# Patient Record
Sex: Female | Born: 1940 | Race: White | Hispanic: No | Smoking: Never smoker
Health system: Southern US, Community
[De-identification: ages and names within clinical notes are randomized; demographics above are authoritative.]

## PROBLEM LIST (undated history)

## (undated) DIAGNOSIS — G8929 Other chronic pain: Secondary | ICD-10-CM

## (undated) DIAGNOSIS — E785 Hyperlipidemia, unspecified: Secondary | ICD-10-CM

## (undated) DIAGNOSIS — M549 Dorsalgia, unspecified: Secondary | ICD-10-CM

## (undated) DIAGNOSIS — K219 Gastro-esophageal reflux disease without esophagitis: Secondary | ICD-10-CM

## (undated) HISTORY — PX: BACK SURGERY: SHX140

## (undated) HISTORY — PX: TONSILLECTOMY: SUR1361

---

## 2015-05-14 ENCOUNTER — Emergency Department (HOSPITAL_BASED_OUTPATIENT_CLINIC_OR_DEPARTMENT_OTHER)
Admission: EM | Admit: 2015-05-14 | Discharge: 2015-05-15 | Disposition: A | Discharge status: Home / Self Care | Payer: Medicare Other | Attending: Emergency Medicine | Admitting: Emergency Medicine

## 2015-05-14 ENCOUNTER — Encounter (HOSPITAL_BASED_OUTPATIENT_CLINIC_OR_DEPARTMENT_OTHER): Payer: Self-pay | Admitting: *Deleted

## 2015-05-14 DIAGNOSIS — Z87440 Personal history of urinary (tract) infections: Secondary | ICD-10-CM | POA: Diagnosis not present

## 2015-05-14 DIAGNOSIS — Z792 Long term (current) use of antibiotics: Secondary | ICD-10-CM | POA: Diagnosis not present

## 2015-05-14 DIAGNOSIS — Z79899 Other long term (current) drug therapy: Secondary | ICD-10-CM | POA: Insufficient documentation

## 2015-05-14 DIAGNOSIS — K219 Gastro-esophageal reflux disease without esophagitis: Secondary | ICD-10-CM | POA: Diagnosis not present

## 2015-05-14 DIAGNOSIS — E785 Hyperlipidemia, unspecified: Secondary | ICD-10-CM | POA: Insufficient documentation

## 2015-05-14 DIAGNOSIS — R110 Nausea: Secondary | ICD-10-CM | POA: Insufficient documentation

## 2015-05-14 DIAGNOSIS — R109 Unspecified abdominal pain: Secondary | ICD-10-CM | POA: Insufficient documentation

## 2015-05-14 DIAGNOSIS — G8929 Other chronic pain: Secondary | ICD-10-CM | POA: Insufficient documentation

## 2015-05-14 DIAGNOSIS — Z791 Long term (current) use of non-steroidal anti-inflammatories (NSAID): Secondary | ICD-10-CM | POA: Insufficient documentation

## 2015-05-14 HISTORY — DX: Other chronic pain: G89.29

## 2015-05-14 HISTORY — DX: Hyperlipidemia, unspecified: E78.5

## 2015-05-14 HISTORY — DX: Dorsalgia, unspecified: M54.9

## 2015-05-14 HISTORY — DX: Gastro-esophageal reflux disease without esophagitis: K21.9

## 2015-05-14 LAB — URINALYSIS, ROUTINE W REFLEX MICROSCOPIC
Bilirubin Urine: NEGATIVE
Glucose, UA: NEGATIVE mg/dL
Hgb urine dipstick: NEGATIVE
Ketones, ur: NEGATIVE mg/dL
Leukocytes, UA: NEGATIVE
Nitrite: NEGATIVE
Protein, ur: NEGATIVE mg/dL
Specific Gravity, Urine: 1.015 (ref 1.005–1.030)
pH: 7 (ref 5.0–8.0)

## 2015-05-14 NOTE — ED Notes (Signed)
Pt c/o right flank pain x 10 hrs , Seen by PMD DX UTI  today given cipro and Vicodin

## 2015-05-14 NOTE — ED Provider Notes (Signed)
CSN: [id number]     Arrival date & time 05/14/15  2115 History  By signing my name below, I, Soijett Blue, attest that this documentation has been prepared under the direction and in the presence of Shon Baton, MD. Electronically Signed: Soijett Blue, ED Scribe. 05/14/2015. 11:49 PM.   Chief Complaint  Patient presents with  . Flank Pain      The history is provided by the patient. No language interpreter was used.    Jeanette Riggs is a 74 y.o. female who presents to the Emergency Department complaining of 9.5/10, sudden, constant, non-radiating, right flank pain onset 11 AM. She reports that she went to an urgent care who informed her that she had an UTI and was Rx cipro and vicodin with her last dose being at 5 PM for the cipro and 3 PM and 9 PM for the vicodin. She reports that applying pressure to the area alleviates her symptoms and that nothing worsens her symptoms at this time. She is having associated symptoms of nausea. She notes that she has not tried any medications for the relief of her symptoms. She denies fever, dysuria, hematuria, CP, SOB, cough, gait problem, difficulty urinating, constipation, vomiting, and any other symptoms. Pt is allergic to prednisone.    Past Medical History  Diagnosis Date  . Hyperlipemia   . GERD (gastroesophageal reflux disease)   . Chronic back pain    Past Surgical History  Procedure Laterality Date  . Back surgery    . Tonsillectomy     History reviewed. No pertinent family history. Social History  Substance Use Topics  . Smoking status: Never Smoker   . Smokeless tobacco: None  . Alcohol Use: No   OB History    No data available     Review of Systems  Constitutional: Negative for fever.  Respiratory: Negative for cough, chest tightness and shortness of breath.   Cardiovascular: Negative for chest pain.  Gastrointestinal: Positive for nausea. Negative for vomiting, abdominal pain and diarrhea.  Genitourinary: Positive for  flank pain. Negative for difficulty urinating.  Skin: Negative for rash.  Neurological: Negative for weakness and numbness.  All other systems reviewed and are negative.     Allergies  Prednisone  Home Medications   Prior to Admission medications   Medication Sig Start Date End Date Taking? Authorizing Provider  ciprofloxacin (CIPRO) 500 MG tablet Take 500 mg by mouth 2 (two) times daily.   Yes Historical Provider, MD  esomeprazole (NEXIUM) 40 MG capsule Take 40 mg by mouth daily at 12 noon.   Yes Historical Provider, MD  meloxicam (MOBIC) 7.5 MG tablet Take 7.5 mg by mouth daily.   Yes Historical Provider, MD  oxyCODONE-acetaminophen (PERCOCET/ROXICET) 5-325 MG tablet Take 1 tablet by mouth every 6 (six) hours as needed for severe pain. 05/15/15   Shon Baton, MD  simvastatin (ZOCOR) 10 MG tablet Take 10 mg by mouth daily.   Yes Historical Provider, MD   BP 163/79 mmHg  Pulse 75  Temp(Src) 97.8 F (36.6 C) (Oral)  Resp 18  Ht  (1.6 m)  Wt 140 lb (63.504 kg)  BMI 24.81 kg/m2  SpO2 97%  LMP 05/14/2015 Physical Exam  Constitutional: She is oriented to person, place, and time. She appears well-developed and well-nourished. No distress.  Appears younger than stated age  HENT:  Head: Normocephalic and atraumatic.  Cardiovascular: Normal rate, regular rhythm and normal heart sounds.   No murmur heard. Pulmonary/Chest: Effort normal and breath  sounds normal. No respiratory distress. She has no wheezes.  Abdominal: Soft. Bowel sounds are normal. There is no tenderness. There is no rebound and no guarding.  Genitourinary:  No CVA tenderness  Musculoskeletal: She exhibits no edema.  Neurological: She is alert and oriented to person, place, and time.  5 out of 5 strength bilateral lower extremities  Skin: Skin is warm and dry. No rash noted.  Psychiatric: She has a normal mood and affect.  Nursing note and vitals reviewed.   ED Course  Procedures (including critical  care time) DIAGNOSTIC STUDIES: Oxygen Saturation is 100% on RA, nl by my interpretation.    COORDINATION OF CARE: 11:48 PM Discussed treatment plan with pt at bedside which includes UA, pain medication and pt agreed to plan.    Labs Review Labs Reviewed  BASIC METABOLIC PANEL - Abnormal; Notable for the following:    Potassium 3.4 (*)    Glucose, Bld 111 (*)    Anion gap 4 (*)    All other components within normal limits  URINALYSIS, ROUTINE W REFLEX MICROSCOPIC (NOT [facility])  CBC WITH DIFFERENTIAL/PLATELET    Imaging Review Ct Renal Stone Study  05/15/2015  CLINICAL DATA:  74 year old female with right flank pain and nausea EXAM: CT ABDOMEN AND PELVIS WITHOUT CONTRAST TECHNIQUE: Multidetector CT imaging of the abdomen and pelvis was performed following the standard protocol without IV contrast. COMPARISON:  None. FINDINGS: Evaluation of this exam is limited in the absence of intravenous contrast. The visualized lung bases are clear. No intra-abdominal free air or free fluid. The liver, gallbladder, pancreas, spleen, adrenal glands appear unremarkable. There are bilateral extrarenal pelvises. There is mild fullness of the renal pelvis bilaterally. There is no hydronephrosis or nephrolithiasis on either side. The visualized ureters and urinary bladder appear unremarkable. The uterus and the ovaries are grossly unremarkable. There is sigmoid diverticulosis with scattered colonic diverticula without active inflammation. Copious amount of dense stool noted throughout the colon compatible with constipation. Multiple normal caliber fecalized loops of small bowel noted suggestive of chronic stasis. There is no evidence of bowel obstruction. Normal appendix. There is aortoiliac atherosclerotic disease. The abdominal aorta and IVC appear grossly unremarkable on this noncontrast study. No portal venous gas identified. There is no adenopathy. There is mild haziness of the upper mesentery with multiple  small lymph nodes with a "misty mesentery" appearance. This finding is nonspecific but may be related to underlying inflammatory/infectious etiology. The abdominal wall soft tissues appear unremarkable. Forty-four there is degenerative changes of the spine. No acute fracture. IMPRESSION: No hydronephrosis or nephrolithiasis. Constipation. No evidence of bowel obstruction or inflammation. Normal appendix. Diverticulosis. Electronically Signed   By: Elgie CollardArash  Radparvar M.D.   On: 05/15/2015 01:18   I have personally reviewed and evaluated these images and lab results as part of my medical decision-making.   EKG Interpretation None      MDM   Final diagnoses:  Flank pain    Patient presents with right flank pain. Onset 10 hours ago. Onset is sudden. Currently on Cipro and Vicodin. No evidence of urinary tract infection and patient has only taken 1 dose of antibiotics. No CVA tenderness on exam. Kidney stone is a consideration. Patient was given fluids, nausea, and pain medication. Basic labwork obtained as well as CT renal stone study.  Pain is not reproducible on exam. She is neurologically intact.  CT is negative for stone.  Basic labwork is reassuring. On recheck, patient reports improvement of symptoms with symptom control.  Discussed with patient that it is unclear this time the etiology for pain. No evidence of UTI. No evidence of renal stone. Otherwise her CT scan is largely unremarkable with the exception of constipation. This could be prodrome to zoster and patient was last if she were to develop a rash she needs to be reevaluated. Patient stated understanding. Continue supportive care home. Patient was given strict return precautions.  After history, exam, and medical workup I feel the patient has been appropriately medically screened and is safe for discharge home. Pertinent diagnoses were discussed with the patient. Patient was given return precautions.  I personally performed the services  described in this documentation, which was scribed in my presence. The recorded information has been reviewed and is accurate.    Shon Baton, MD 05/15/15 [phone]

## 2015-05-15 ENCOUNTER — Emergency Department (HOSPITAL_BASED_OUTPATIENT_CLINIC_OR_DEPARTMENT_OTHER): Payer: Medicare Other

## 2015-05-15 DIAGNOSIS — R109 Unspecified abdominal pain: Secondary | ICD-10-CM | POA: Diagnosis not present

## 2015-05-15 LAB — BASIC METABOLIC PANEL
ANION GAP: 4 — AB (ref 5–15)
BUN: 17 mg/dL (ref 6–20)
CALCIUM: 9.6 mg/dL (ref 8.9–10.3)
CHLORIDE: 103 mmol/L (ref 101–111)
CO2: 29 mmol/L (ref 22–32)
Creatinine, Ser: 0.69 mg/dL (ref 0.44–1.00)
GFR calc non Af Amer: >60 mL/min (ref >60)
GLUCOSE: 111 mg/dL — AB (ref 65–99)
POTASSIUM: 3.4 mmol/L — AB (ref 3.5–5.1)
Sodium: 136 mmol/L (ref 135–145)

## 2015-05-15 LAB — CBC WITH DIFFERENTIAL/PLATELET
BASOS ABS: 0 10*3/uL (ref 0.0–0.1)
BASOS PCT: 1 %
Eosinophils Absolute: 0.1 10*3/uL (ref 0.0–0.7)
Eosinophils Relative: 1 %
HEMATOCRIT: 40 % (ref 36.0–46.0)
HEMOGLOBIN: 13.3 g/dL (ref 12.0–15.0)
LYMPHS PCT: 18 %
Lymphs Abs: 1.6 10*3/uL (ref 0.7–4.0)
MCH: 31.1 pg (ref 26.0–34.0)
MCHC: 33.3 g/dL (ref 30.0–36.0)
MCV: 93.7 fL (ref 78.0–100.0)
MONO ABS: 0.8 10*3/uL (ref 0.1–1.0)
Monocytes Relative: 9 %
NEUTROS ABS: 6.1 10*3/uL (ref 1.7–7.7)
NEUTROS PCT: 71 %
Platelets: 299 10*3/uL (ref 150–400)
RBC: 4.27 MIL/uL (ref 3.87–5.11)
RDW: 12.1 % (ref 11.5–15.5)
WBC: 8.6 10*3/uL (ref 4.0–10.5)

## 2015-05-15 MED ORDER — OXYCODONE-ACETAMINOPHEN 5-325 MG PO TABS: 1.0000 | ORAL_TABLET | Freq: Four times a day (QID) | ORAL | Status: DC | PRN

## 2015-05-15 MED ORDER — ONDANSETRON HCL 4 MG/2ML IJ SOLN
4.0000 mg | Freq: Once | INTRAMUSCULAR | Status: AC
  Administered 2015-05-15: 4 mg via INTRAVENOUS
  Filled 2015-05-15: qty 2

## 2015-05-15 MED ORDER — MORPHINE SULFATE (PF) 4 MG/ML IV SOLN
4.0000 mg | Freq: Once | INTRAVENOUS | Status: AC
  Administered 2015-05-15: 4 mg via INTRAVENOUS
  Filled 2015-05-15: qty 1

## 2015-05-15 MED ORDER — SODIUM CHLORIDE 0.9 % IV BOLUS (SEPSIS)
1000.0000 mL | Freq: Once | INTRAVENOUS | Status: AC
  Administered 2015-05-15: 1000 mL via INTRAVENOUS

## 2015-05-15 NOTE — ED Notes (Signed)
Ginger Ale given for p.o. challenge  

## 2015-05-15 NOTE — Discharge Instructions (Signed)
Flank Pain °Flank pain refers to pain that is located on the side of the body between the upper abdomen and the back. The pain may occur over a short period of time (acute) or may be long-term or reoccurring (chronic). It may be mild or severe. Flank pain can be caused by many things. °CAUSES  °Some of the more common causes of flank pain include: °· Muscle strains.   °· Muscle spasms.   °· A disease of your spine (vertebral disk disease).   °· A lung infection (pneumonia).   °· Fluid around your lungs (pulmonary edema).   °· A kidney infection.   °· Kidney stones.   °· A very painful skin rash caused by the chickenpox virus (shingles).   °· Gallbladder disease.   °HOME CARE INSTRUCTIONS  °Home care will depend on the cause of your pain. In general, °· Rest as directed by your caregiver. °· Drink enough fluids to keep your urine clear or pale yellow. °· Only take over-the-counter or prescription medicines as directed by your caregiver. Some medicines may help relieve the pain. °· Tell your caregiver about any changes in your pain. °· Follow up with your caregiver as directed. °SEEK IMMEDIATE MEDICAL CARE IF:  °· Your pain is not controlled with medicine.   °· You have new or worsening symptoms. °· Your pain increases.   °· You have abdominal pain.   °· You have shortness of breath.   °· You have persistent nausea or vomiting.   °· You have swelling in your abdomen.   °· You feel faint or pass out.   °· You have blood in your urine. °· You have a fever or persistent symptoms for more than 2-3 days. °· You have a fever and your symptoms suddenly get worse. °MAKE SURE YOU:  °· Understand these instructions. °· Will watch your condition. °· Will get help right away if you are not doing well or get worse. °  °This information is not intended to replace advice given to you by your health care provider. Make sure you discuss any questions you have with your health care provider. °  °Document Released: 06/29/2005 Document  Revised: 01/31/2012 Document Reviewed: 12/21/2011 °Elsevier Interactive Patient Education ©2016 [employer] ° °

## 2015-05-15 NOTE — ED Notes (Signed)
Pt verbalizes understanding of d/c instructions and denies any further needs at this time. 

## 2017-04-13 IMAGING — CT CT RENAL STONE PROTOCOL
2 of 3 series · 15 of 46 positions shown, 17 images · non-contrast
Comparison: None.

CLINICAL DATA: 74-year-old female with right flank pain and nausea

EXAM:
CT ABDOMEN AND PELVIS WITHOUT CONTRAST
TECHNIQUE: Multidetector CT imaging of the abdomen and pelvis was performed
following the standard protocol without IV contrast.

[Series 2: stone study 5.0 i30f 1 · axial · 0.91mm/px · z∈[-446,-61]mm · 12 of 89 slices shown, 14 images]
[im 6/89  soft-tissue]
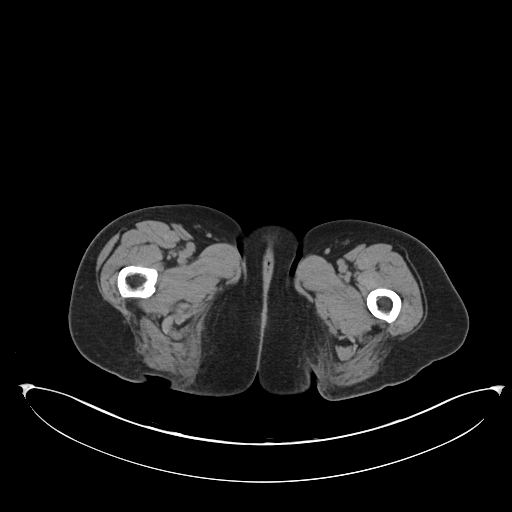
[im 6/89  bone]
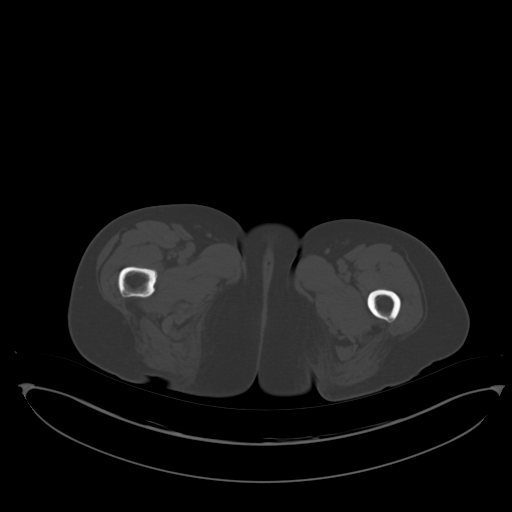
[im 12/89  soft-tissue]
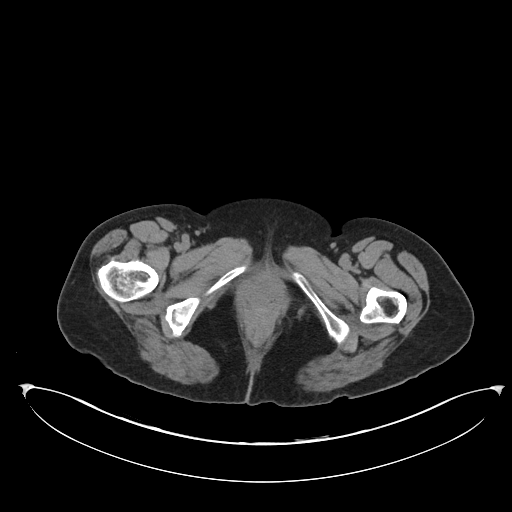
[im 20/89  soft-tissue]
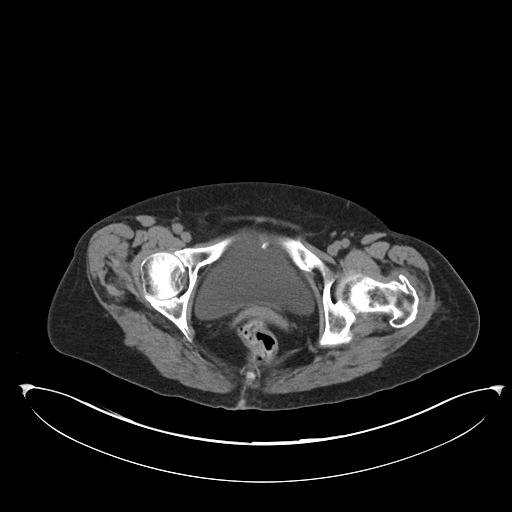
[im 26/89  soft-tissue]
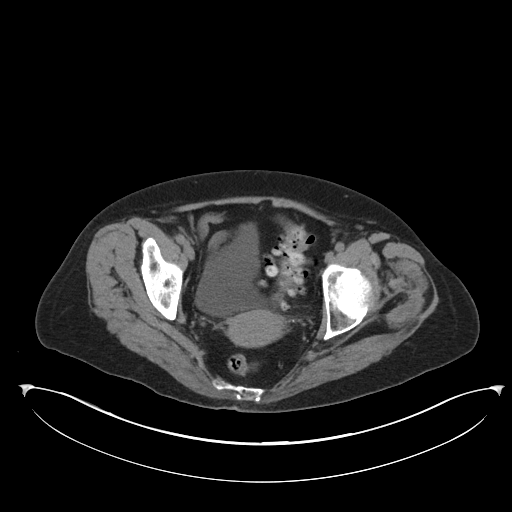
[im 35/89  soft-tissue]
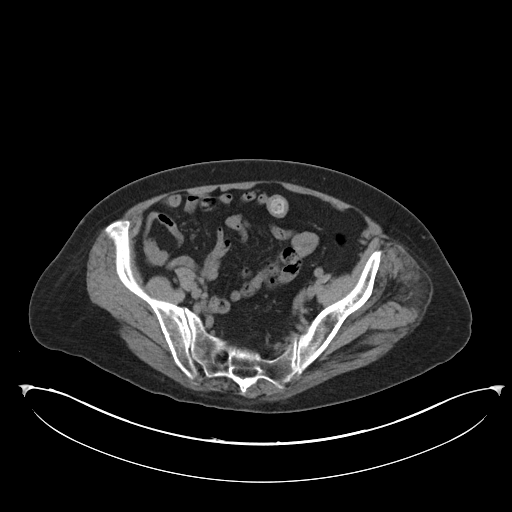
[im 40/89  soft-tissue]
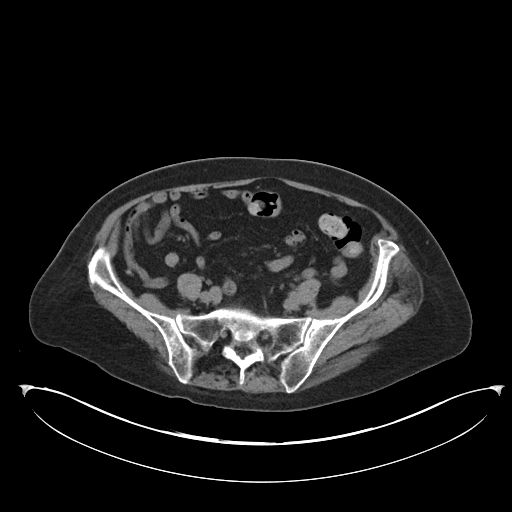
[im 49/89  soft-tissue]
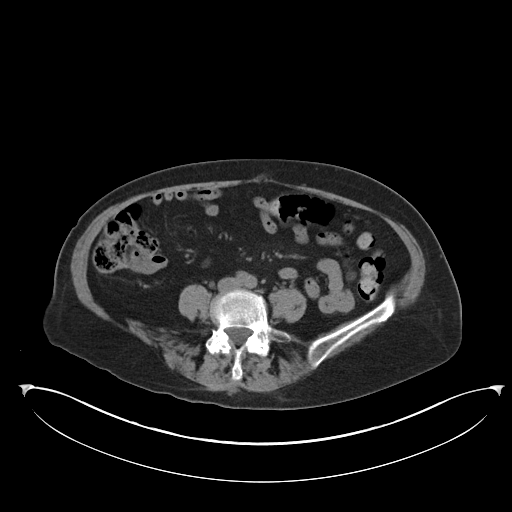
[im 54/89  soft-tissue]
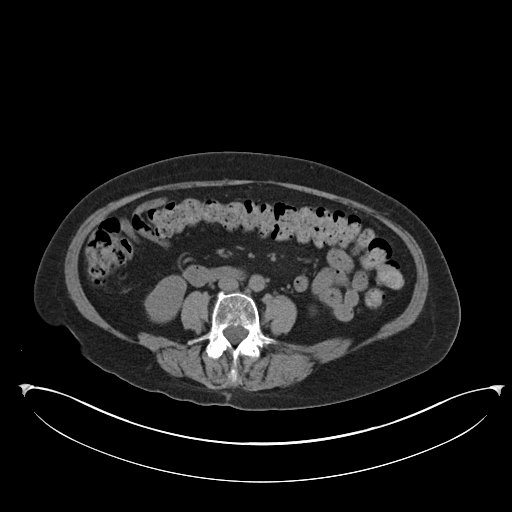
[im 63/89  soft-tissue]
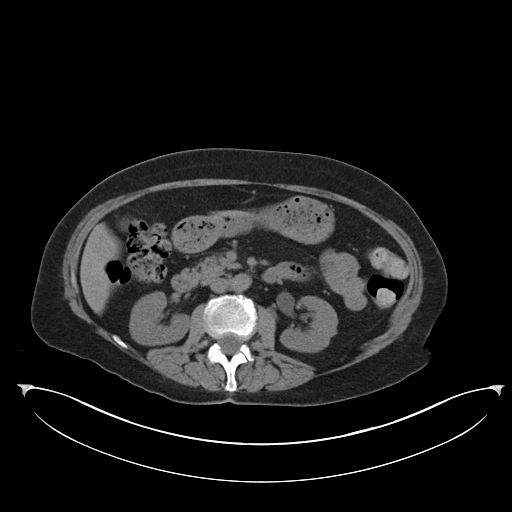
[im 63/89  bone]
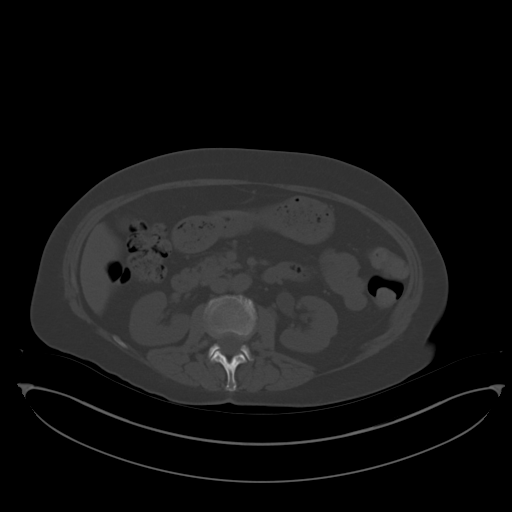
[im 69/89  soft-tissue]
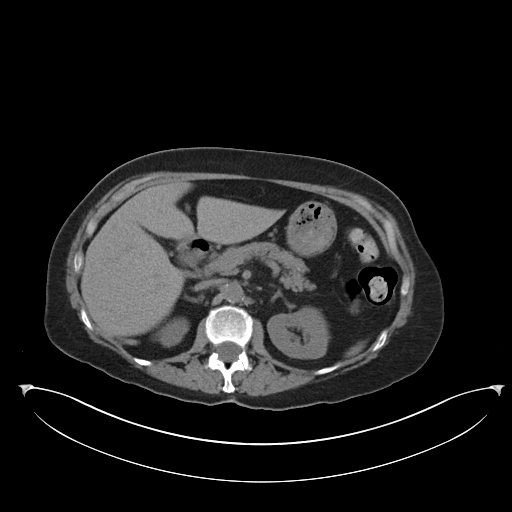
[im 77/89  soft-tissue]
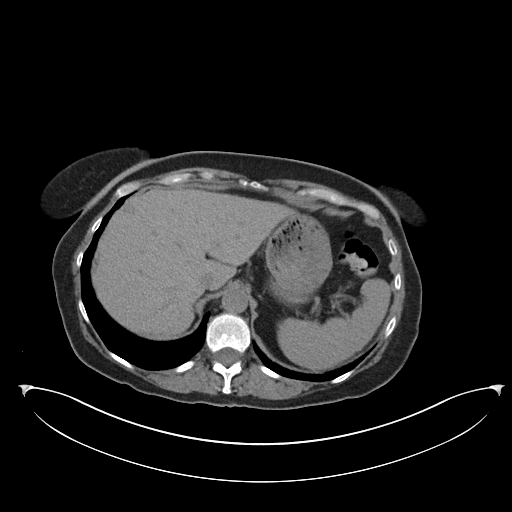
[im 83/89  soft-tissue]
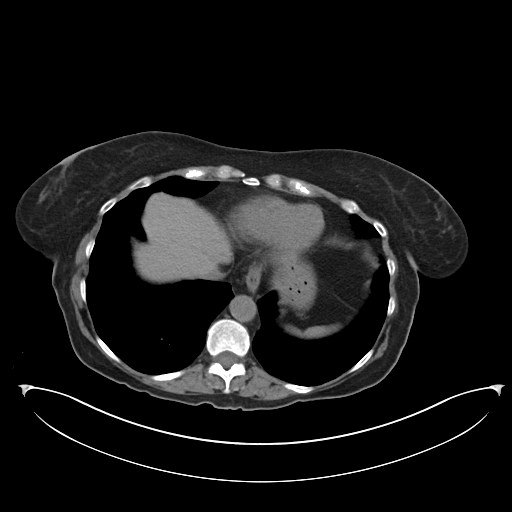

[Series 5: coronal soft tissue · coronal · 0.96mm/px · 3 of 79 slices shown]
[im 27/79  soft-tissue]
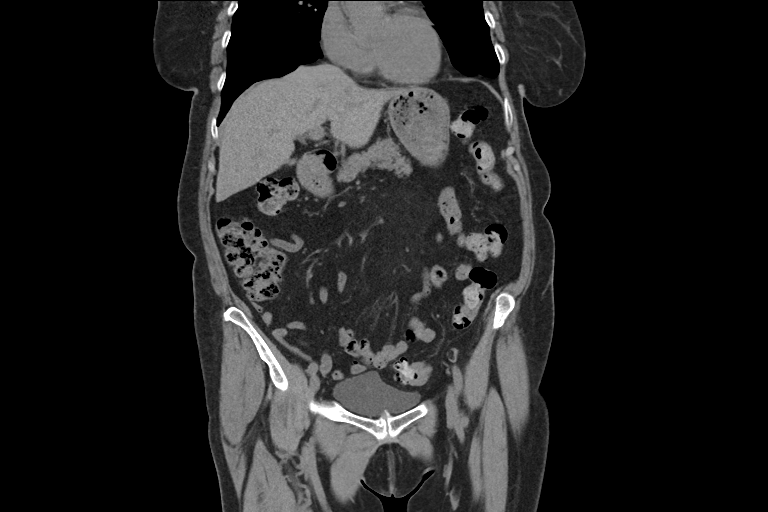
[im 35/79  soft-tissue]
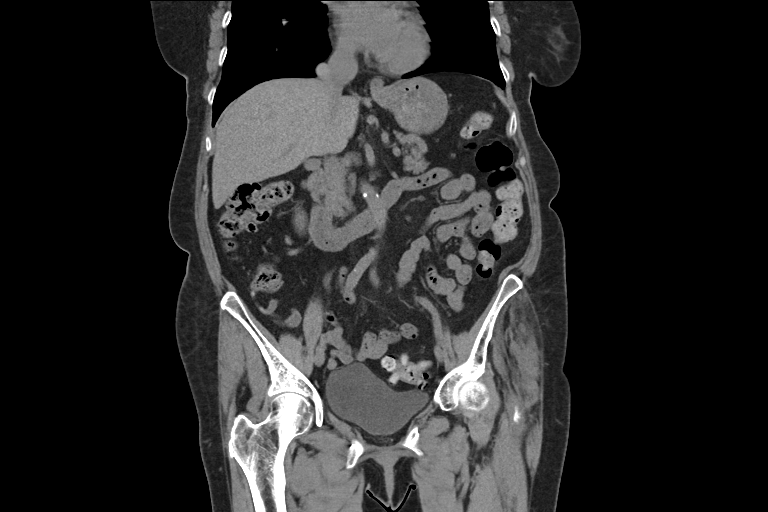
[im 44/79  soft-tissue]
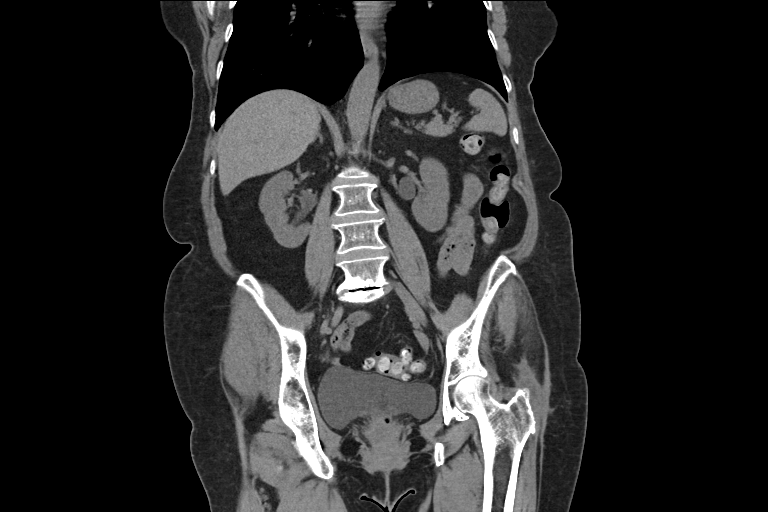

[15 of 46 positions shown; findings below may reference images not displayed]

FINDINGS: Evaluation of this exam is limited in the absence of intravenous
contrast.

The visualized lung bases are clear. No intra-abdominal free air or
free fluid. The liver, gallbladder, pancreas, spleen, adrenal glands
appear unremarkable. There are bilateral extrarenal pelvises. There
is mild fullness of the renal pelvis bilaterally. There is no
hydronephrosis or nephrolithiasis on either side. The visualized
ureters and urinary bladder appear unremarkable. The uterus and the
ovaries are grossly unremarkable.

There is sigmoid diverticulosis with scattered colonic diverticula
without active inflammation. Copious amount of dense stool noted
throughout the colon compatible with constipation. Multiple normal
caliber fecalized loops of small bowel noted suggestive of chronic
stasis. There is no evidence of bowel obstruction. Normal appendix.

There is aortoiliac atherosclerotic disease. The abdominal aorta and
IVC appear grossly unremarkable on this noncontrast study. No portal
venous gas identified. There is no adenopathy. There is mild
haziness of the upper mesentery with multiple small lymph nodes with
a "misty mesentery" appearance. This finding is nonspecific but may
be related to underlying inflammatory/infectious etiology.

The abdominal wall soft tissues appear unremarkable. Forty-four
there is degenerative changes of the spine. No acute fracture.
IMPRESSION: No hydronephrosis or nephrolithiasis.

Constipation. No evidence of bowel obstruction or inflammation.
Normal appendix.

Diverticulosis.

## 2019-06-17 ENCOUNTER — Ambulatory Visit: Payer: Medicare Other

## 2019-06-26 ENCOUNTER — Ambulatory Visit: Payer: Medicare Other | Attending: Internal Medicine

## 2019-06-26 DIAGNOSIS — Z23 Encounter for immunization: Secondary | ICD-10-CM | POA: Insufficient documentation

## 2019-06-26 NOTE — Progress Notes (Signed)
   Covid-19 Vaccination Clinic  Name:  Addy Mcmannis    MRN: [id number] DOB: 05-23-1940  06/26/2019  Ms. Balint was observed post Covid-19 immunization for 15 minutes without incidence. She was provided with Vaccine Information Sheet and instruction to access the V-Safe system.   Ms. Fini was instructed to call 911 with any severe reactions post vaccine: Marland Kitchen Difficulty breathing  . Swelling of your face and throat  . A fast heartbeat  . A bad rash all over your body  . Dizziness and weakness    Immunizations Administered    Name Date Dose VIS Date Route   Pfizer COVID-19 Vaccine 06/26/2019  9:03 AM 0.3 mL 05/02/2019 Intramuscular   Manufacturer: [employer], [employer]   Lot: [id number]   NDC: [address]-4

## 2019-07-21 ENCOUNTER — Ambulatory Visit: Payer: Medicare Other | Attending: Internal Medicine

## 2019-07-21 DIAGNOSIS — Z23 Encounter for immunization: Secondary | ICD-10-CM | POA: Insufficient documentation

## 2019-07-21 NOTE — Progress Notes (Signed)
   Covid-19 Vaccination Clinic  Name:  Jeanette Riggs    MRN: [id number] DOB: 1940-09-20  07/21/2019  Jeanette Riggs was observed post Covid-19 immunization for 15 minutes without incidence. She was provided with Vaccine Information Sheet and instruction to access the V-Safe system.   Jeanette Riggs was instructed to call 911 with any severe reactions post vaccine: Marland Kitchen Difficulty breathing  . Swelling of your face and throat  . A fast heartbeat  . A bad rash all over your body  . Dizziness and weakness    Immunizations Administered    Name Date Dose VIS Date Route   Pfizer COVID-19 Vaccine 07/21/2019  1:20 PM 0.3 mL 05/02/2019 Intramuscular   Manufacturer: [employer], [employer]   Lot: [id number]   NDC: [address]-2

## 2024-03-06 ENCOUNTER — Encounter (HOSPITAL_BASED_OUTPATIENT_CLINIC_OR_DEPARTMENT_OTHER): Payer: Self-pay | Admitting: Emergency Medicine

## 2024-03-06 ENCOUNTER — Emergency Department (HOSPITAL_BASED_OUTPATIENT_CLINIC_OR_DEPARTMENT_OTHER)

## 2024-03-06 ENCOUNTER — Other Ambulatory Visit: Payer: Self-pay

## 2024-03-06 ENCOUNTER — Emergency Department (HOSPITAL_BASED_OUTPATIENT_CLINIC_OR_DEPARTMENT_OTHER)
Admission: EM | Admit: 2024-03-06 | Discharge: 2024-03-07 | Disposition: A | Discharge status: Home / Self Care | Attending: Emergency Medicine | Admitting: Emergency Medicine

## 2024-03-06 DIAGNOSIS — R109 Unspecified abdominal pain: Secondary | ICD-10-CM | POA: Diagnosis not present

## 2024-03-06 DIAGNOSIS — D72829 Elevated white blood cell count, unspecified: Secondary | ICD-10-CM | POA: Insufficient documentation

## 2024-03-06 DIAGNOSIS — R112 Nausea with vomiting, unspecified: Secondary | ICD-10-CM | POA: Insufficient documentation

## 2024-03-06 DIAGNOSIS — R197 Diarrhea, unspecified: Secondary | ICD-10-CM | POA: Insufficient documentation

## 2024-03-06 LAB — URINALYSIS, ROUTINE W REFLEX MICROSCOPIC
Bilirubin Urine: NEGATIVE
Glucose, UA: NEGATIVE mg/dL
Ketones, ur: NEGATIVE mg/dL
Leukocytes,Ua: NEGATIVE
Nitrite: NEGATIVE
Protein, ur: >=300 mg/dL — AB
Specific Gravity, Urine: >=1.03 (ref 1.005–1.030)
pH: 8.5 — ABNORMAL HIGH (ref 5.0–8.0)

## 2024-03-06 LAB — COMPREHENSIVE METABOLIC PANEL WITH GFR
ALT: 16 U/L (ref 0–44)
AST: 23 U/L (ref 15–41)
Albumin: 4.8 g/dL (ref 3.5–5.0)
Alkaline Phosphatase: 108 U/L (ref 38–126)
Anion gap: 14 (ref 5–15)
BUN: 10 mg/dL (ref 8–23)
CO2: 23 mmol/L (ref 22–32)
Calcium: 9.8 mg/dL (ref 8.9–10.3)
Chloride: 100 mmol/L (ref 98–111)
Creatinine, Ser: 0.75 mg/dL (ref 0.44–1.00)
GFR, Estimated: >60 mL/min (ref >60)
Glucose, Bld: 152 mg/dL — ABNORMAL HIGH (ref 70–99)
Potassium: 3.9 mmol/L (ref 3.5–5.1)
Sodium: 137 mmol/L (ref 135–145)
Total Bilirubin: 0.6 mg/dL (ref 0.0–1.2)
Total Protein: 7.7 g/dL (ref 6.5–8.1)

## 2024-03-06 LAB — URINALYSIS, MICROSCOPIC (REFLEX)

## 2024-03-06 LAB — CBC
HCT: 44.3 % (ref 36.0–46.0)
Hemoglobin: 15.2 g/dL — ABNORMAL HIGH (ref 12.0–15.0)
MCH: 31.5 pg (ref 26.0–34.0)
MCHC: 34.3 g/dL (ref 30.0–36.0)
MCV: 91.7 fL (ref 80.0–100.0)
Platelets: 314 K/uL (ref 150–400)
RBC: 4.83 MIL/uL (ref 3.87–5.11)
RDW: 12.5 % (ref 11.5–15.5)
WBC: 12.1 K/uL — ABNORMAL HIGH (ref 4.0–10.5)
nRBC: 0 % (ref 0.0–0.2)

## 2024-03-06 LAB — TROPONIN T, HIGH SENSITIVITY
Troponin T High Sensitivity: <15 ng/L (ref 0–19)
Troponin T High Sensitivity: 27 ng/L — ABNORMAL HIGH (ref 0–19)

## 2024-03-06 LAB — RESP PANEL BY RT-PCR (RSV, FLU A&B, COVID)  RVPGX2
Influenza A by PCR: NEGATIVE
Influenza B by PCR: NEGATIVE
Resp Syncytial Virus by PCR: NEGATIVE
SARS Coronavirus 2 by RT PCR: NEGATIVE

## 2024-03-06 LAB — LIPASE, BLOOD: Lipase: 26 U/L (ref 11–51)

## 2024-03-06 MED ORDER — SODIUM CHLORIDE 0.9 % IV BOLUS
500.0000 mL | Freq: Once | INTRAVENOUS | Status: AC
  Administered 2024-03-06: 500 mL via INTRAVENOUS

## 2024-03-06 MED ORDER — ONDANSETRON HCL 4 MG/2ML IJ SOLN
4.0000 mg | Freq: Once | INTRAMUSCULAR | Status: AC
  Administered 2024-03-06: 4 mg via INTRAVENOUS

## 2024-03-06 MED ORDER — SODIUM CHLORIDE 0.9 % IV SOLN: INTRAVENOUS | Status: DC

## 2024-03-06 MED ORDER — ONDANSETRON HCL 4 MG/2ML IJ SOLN
4.0000 mg | Freq: Once | INTRAMUSCULAR | Status: AC
  Administered 2024-03-06: 4 mg via INTRAVENOUS
  Filled 2024-03-06: qty 2

## 2024-03-06 MED ORDER — IOHEXOL 300 MG/ML  SOLN
100.0000 mL | Freq: Once | INTRAMUSCULAR | Status: AC | PRN
  Administered 2024-03-06: 100 mL via INTRAVENOUS

## 2024-03-06 MED ORDER — ONDANSETRON HCL 4 MG/2ML IJ SOLN
INTRAMUSCULAR | Status: AC
  Filled 2024-03-06: qty 2

## 2024-03-06 NOTE — ED Provider Notes (Signed)
 Care assumed at shift change. Here for NVD, awaiting CT.  Physical Exam  BP (!) 199/91   Pulse 80   Temp 98.2 F (36.8 C) (Oral)   Resp 16   Ht 5' 3 (1.6 m)   Wt 72.1 kg   LMP 05/14/2015   SpO2 96%   BMI 28.17 kg/m   Physical Exam  Procedures  Procedures  ED Course / MDM   Clinical Course as of 03/07/24 0200  Fri Mar 07, 2024  0102 I personally viewed the images from radiology studies and agree with radiologist interpretation: CT is neg for acute process. Delta trop is marginally increased from previous. [CS]  0158 Patient reports she is feeling some better but not back to baseline. She is tolerating sips of gingerale now. She repots her BP normally is fine at home and she is not on HTN meds but is usually elevated in hospital setting. Given her delta trop and continued, albeit improved, nausea I offered admission but she is adamant about going home. She will be given Rx for Zofran , recommend she monitor BP. PCP follow up, RTED for any other concerns.   [CS]    Clinical Course User Index [CS] Roselyn Carlin NOVAK, MD   Medical Decision Making Problems Addressed: Nausea vomiting and diarrhea: acute illness or injury  Amount and/or Complexity of Data Reviewed Labs: ordered. Decision-making details documented in ED Course. Radiology: ordered and independent interpretation performed. Decision-making details documented in ED Course.  Risk Prescription drug management. Decision regarding hospitalization.          Roselyn Carlin NOVAK, MD 03/07/24 0200

## 2024-03-06 NOTE — ED Notes (Signed)
 Pt called out with increased nausea. Zofran  ordered per protocol

## 2024-03-06 NOTE — ED Triage Notes (Signed)
 Pt c/o emesis since appx 1200 today.  Denies fever.

## 2024-03-06 NOTE — ED Provider Notes (Addendum)
  EMERGENCY DEPARTMENT AT [facility] Provider Note   CSN: [id number] Arrival date & time: 03/06/24  1941     Patient presents with: No chief complaint on file.   Jeanette Riggs is a 83 y.o. female.   Patient with acute onset at 12 noon of nausea and multiple episodes of vomiting and some diarrhea.  No blood in either 1.  No real abdominal pain other than just some generalized discomfort in the nausea.  No fever.  No sore throat no upper respiratory symptoms.  No known sick exposure.  Past medical history significant hyperlipidemia gastroesophageal reflux disease and Prayson is on Nexium for that.  Vital signs here temp 98.7 pulse 82 respiration 16 blood pressure is 189/94.  And 98% oxygen sats.  Patient without known history of hypertension but could have unknown history of hypertension.  Past surgical history significant for tonsillectomy.  And back surgery.       Prior to Admission medications   Medication Sig Start Date End Date Taking? Authorizing Provider  ciprofloxacin (CIPRO) 500 MG tablet Take 500 mg by mouth 2 (two) times daily.    [provider]  esomeprazole (NEXIUM) 40 MG capsule Take 40 mg by mouth daily at 12 noon.    [provider]  meloxicam (MOBIC) 7.5 MG tablet Take 7.5 mg by mouth daily.    [provider]  oxyCODONE -acetaminophen  (PERCOCET/ROXICET) 5-325 MG tablet Take 1 tablet by mouth every 6 (six) hours as needed for severe pain. 05/15/15   Horton, Charmaine FALCON, MD  simvastatin (ZOCOR) 10 MG tablet Take 10 mg by mouth daily.    [provider]    Allergies: Prednisone    Review of Systems  Constitutional:  Negative for chills and fever.  HENT:  Negative for ear pain and sore throat.   Eyes:  Negative for pain and visual disturbance.  Respiratory:  Negative for cough and shortness of breath.   Cardiovascular:  Negative for chest pain and palpitations.  Gastrointestinal:  Positive for abdominal pain,  diarrhea, nausea and vomiting.  Genitourinary:  Negative for dysuria and hematuria.  Musculoskeletal:  Negative for arthralgias and back pain.  Skin:  Negative for color change and rash.  Neurological:  Negative for seizures and syncope.  All other systems reviewed and are negative.   Updated Vital Signs BP (!) 189/94   Pulse 80   Temp 98.7 F (37.1 C) (Oral)   Resp 18   Ht 1.6 m (5' 3)   Wt 72.1 kg   LMP 05/14/2015   SpO2 98%   BMI 28.17 kg/m   Physical Exam Vitals and nursing note reviewed.  Constitutional:      General: She is not in acute distress.    Appearance: Normal appearance. She is well-developed.  HENT:     Head: Normocephalic and atraumatic.     Mouth/Throat:     Mouth: Mucous membranes are dry.  Eyes:     Conjunctiva/sclera: Conjunctivae normal.  Cardiovascular:     Rate and Rhythm: Normal rate and regular rhythm.     Heart sounds: No murmur heard. Pulmonary:     Effort: Pulmonary effort is normal. No respiratory distress.     Breath sounds: Normal breath sounds.  Abdominal:     General: There is no distension.     Palpations: Abdomen is soft.     Tenderness: There is no abdominal tenderness. There is no guarding.  Musculoskeletal:        General: No swelling.  Cervical back: Normal range of motion and neck supple.     Right lower leg: No edema.     Left lower leg: No edema.  Skin:    General: Skin is warm and dry.     Capillary Refill: Capillary refill takes less than 2 seconds.  Neurological:     General: No focal deficit present.     Mental Status: She is alert and oriented to person, place, and time.  Psychiatric:        Mood and Affect: Mood normal.     (all labs ordered are listed, but only abnormal results are displayed) Labs Reviewed  COMPREHENSIVE METABOLIC PANEL WITH GFR - Abnormal; Notable for the following components:      Result Value   Glucose, Bld 152 (*)    All other components within normal limits  CBC - Abnormal;  Notable for the following components:   WBC 12.1 (*)    Hemoglobin 15.2 (*)    All other components within normal limits  URINALYSIS, ROUTINE W REFLEX MICROSCOPIC - Abnormal; Notable for the following components:   pH 8.5 (*)    Hgb urine dipstick TRACE (*)    Protein, ur >=300 (*)    All other components within normal limits  URINALYSIS, MICROSCOPIC (REFLEX) - Abnormal; Notable for the following components:   Bacteria, UA RARE (*)    All other components within normal limits  RESP PANEL BY RT-PCR (RSV, FLU A&B, COVID)  [id number]  LIPASE, BLOOD  TROPONIN T, HIGH SENSITIVITY  TROPONIN T, HIGH SENSITIVITY    EKG: EKG Interpretation Date/Time:  Thursday March 06 2024 20:17:07 EDT Ventricular Rate:  79 PR Interval:  194 QRS Duration:  83 QT Interval:  408 QTC Calculation: 468 R Axis:   44  Text Interpretation: Sinus rhythm Abnormal R-wave progression, early transition No previous ECGs available Confirmed by Xayvion Shirah [phone]) on 03/06/2024 10:32:35 PM  Radiology: No results found.   Procedures   Medications Ordered in the ED  sodium chloride  0.9 % bolus 500 mL (has no administration in time range)  0.9 %  sodium chloride  infusion (has no administration in time range)  ondansetron  (ZOFRAN ) injection 4 mg (has no administration in time range)  ondansetron  (ZOFRAN ) injection 4 mg (4 mg Intravenous Given 03/06/24 2144)                                    Medical Decision Making Amount and/or Complexity of Data Reviewed Labs: ordered. Radiology: ordered.  Risk Prescription drug management.   Patient's respiratory panel pending.  Urinalysis negative.  Not consistent with urinary tract infection.  Lipase normal at 26 complete metabolic panel normal including renal function and electrolytes.  CBC white count 12.1 hemoglobin 15.2 platelets 314.  Initial troponin less than 15.  Patient with the nausea vomiting and diarrhea and abdominal discomfort will get CT scan  abdomen pelvis.  Will give another dose of Zofran  first dose of Zofran  helped a lot.  And will give IV fluids.  Could be related to a stomach bug.  Final diagnoses:  Nausea vomiting and diarrhea    ED Discharge Orders     None          Geraldene Hamilton, MD 03/06/24 [id number]    Geraldene Hamilton, MD 03/06/24 2245

## 2024-03-07 DIAGNOSIS — R112 Nausea with vomiting, unspecified: Secondary | ICD-10-CM | POA: Diagnosis not present

## 2024-03-07 MED ORDER — ONDANSETRON 4 MG PO TBDP: 4.0000 mg | ORAL_TABLET | Freq: Three times a day (TID) | ORAL | 0 refills | Status: AC | PRN

## 2024-03-07 NOTE — ED Notes (Signed)
 Pt medically cleared for discharge. Tolerating PO well. DC instructions given, PIV removed. Out of ED with steady gait, all belongings and paperwork not in visible distress.

## 2024-03-07 NOTE — ED Notes (Signed)
 Ginger ale given for PO challenge.
